# Patient Record
Sex: Male | Born: 1955 | Hispanic: No | Marital: Single
Health system: Southern US, Community
[De-identification: ages and names within clinical notes are randomized; demographics above are authoritative.]

## PROBLEM LIST (undated history)

## (undated) DIAGNOSIS — E079 Disorder of thyroid, unspecified: Secondary | ICD-10-CM

## (undated) DIAGNOSIS — K8689 Other specified diseases of pancreas: Secondary | ICD-10-CM

## (undated) DIAGNOSIS — E1140 Type 2 diabetes mellitus with diabetic neuropathy, unspecified: Secondary | ICD-10-CM

## (undated) DIAGNOSIS — N19 Unspecified kidney failure: Secondary | ICD-10-CM

## (undated) DIAGNOSIS — B1920 Unspecified viral hepatitis C without hepatic coma: Secondary | ICD-10-CM

## (undated) DIAGNOSIS — E039 Hypothyroidism, unspecified: Secondary | ICD-10-CM

## (undated) DIAGNOSIS — I10 Essential (primary) hypertension: Secondary | ICD-10-CM

## (undated) HISTORY — PX: BELOW KNEE LEG AMPUTATION: SUR23

## (undated) HISTORY — PX: KIDNEY TRANSPLANT: SHX239

---

## 2008-11-01 ENCOUNTER — Encounter (HOSPITAL_BASED_OUTPATIENT_CLINIC_OR_DEPARTMENT_OTHER): Admission: RE | Admit: 2008-11-01 | Discharge: 2009-01-25 | Payer: Self-pay | Admitting: General Surgery

## 2008-11-30 ENCOUNTER — Ambulatory Visit: Admission: RE | Admit: 2008-11-30 | Discharge: 2008-11-30 | Payer: Self-pay | Admitting: Internal Medicine

## 2008-11-30 ENCOUNTER — Encounter (HOSPITAL_BASED_OUTPATIENT_CLINIC_OR_DEPARTMENT_OTHER): Payer: Self-pay | Admitting: Internal Medicine

## 2008-11-30 ENCOUNTER — Ambulatory Visit: Payer: Self-pay | Admitting: Surgery

## 2009-01-26 ENCOUNTER — Encounter (HOSPITAL_BASED_OUTPATIENT_CLINIC_OR_DEPARTMENT_OTHER): Admission: RE | Admit: 2009-01-26 | Discharge: 2009-02-22 | Payer: Self-pay | Admitting: Internal Medicine

## 2009-02-10 ENCOUNTER — Ambulatory Visit: Payer: Self-pay | Admitting: Internal Medicine

## 2009-02-10 ENCOUNTER — Inpatient Hospital Stay (HOSPITAL_COMMUNITY): Admission: EM | Admit: 2009-02-10 | Discharge: 2009-02-16 | Payer: Self-pay | Admitting: Emergency Medicine

## 2009-02-11 ENCOUNTER — Encounter (INDEPENDENT_AMBULATORY_CARE_PROVIDER_SITE_OTHER): Payer: Self-pay | Admitting: Internal Medicine

## 2009-02-13 ENCOUNTER — Encounter (INDEPENDENT_AMBULATORY_CARE_PROVIDER_SITE_OTHER): Payer: Self-pay | Admitting: Internal Medicine

## 2009-02-23 ENCOUNTER — Ambulatory Visit: Payer: Self-pay | Admitting: Vascular Surgery

## 2009-02-23 ENCOUNTER — Encounter (HOSPITAL_BASED_OUTPATIENT_CLINIC_OR_DEPARTMENT_OTHER): Admission: RE | Admit: 2009-02-23 | Discharge: 2009-03-29 | Payer: Self-pay | Admitting: Internal Medicine

## 2009-03-24 ENCOUNTER — Encounter (INDEPENDENT_AMBULATORY_CARE_PROVIDER_SITE_OTHER): Payer: Self-pay | Admitting: Orthopedic Surgery

## 2009-03-24 ENCOUNTER — Inpatient Hospital Stay (HOSPITAL_COMMUNITY): Admission: RE | Admit: 2009-03-24 | Discharge: 2009-03-28 | Payer: Self-pay | Admitting: Orthopedic Surgery

## 2010-03-31 IMAGING — CR DG FOOT COMPLETE 3+V*R*
3 series · 3 of 3 positions shown · non-contrast
Comparison: None

CLINICAL DATA: Open wound at the heel, the foot is swollen, the
patient is diabetic

RIGHT FOOT COMPLETE - 3+ VIEW

[view not recorded (1 of 3)]
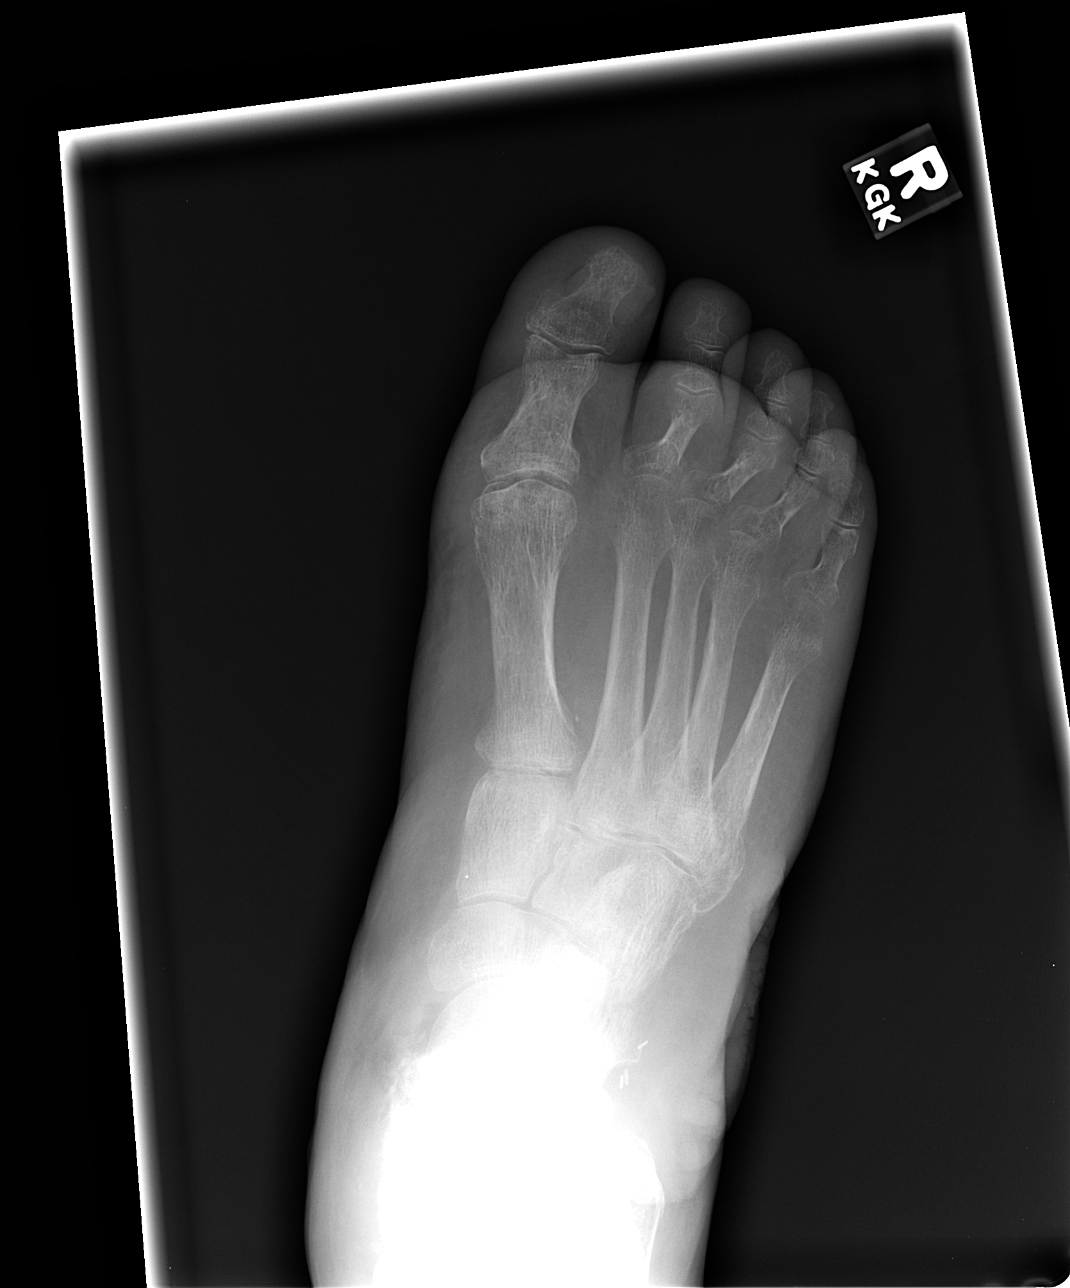

[view not recorded (2 of 3)]
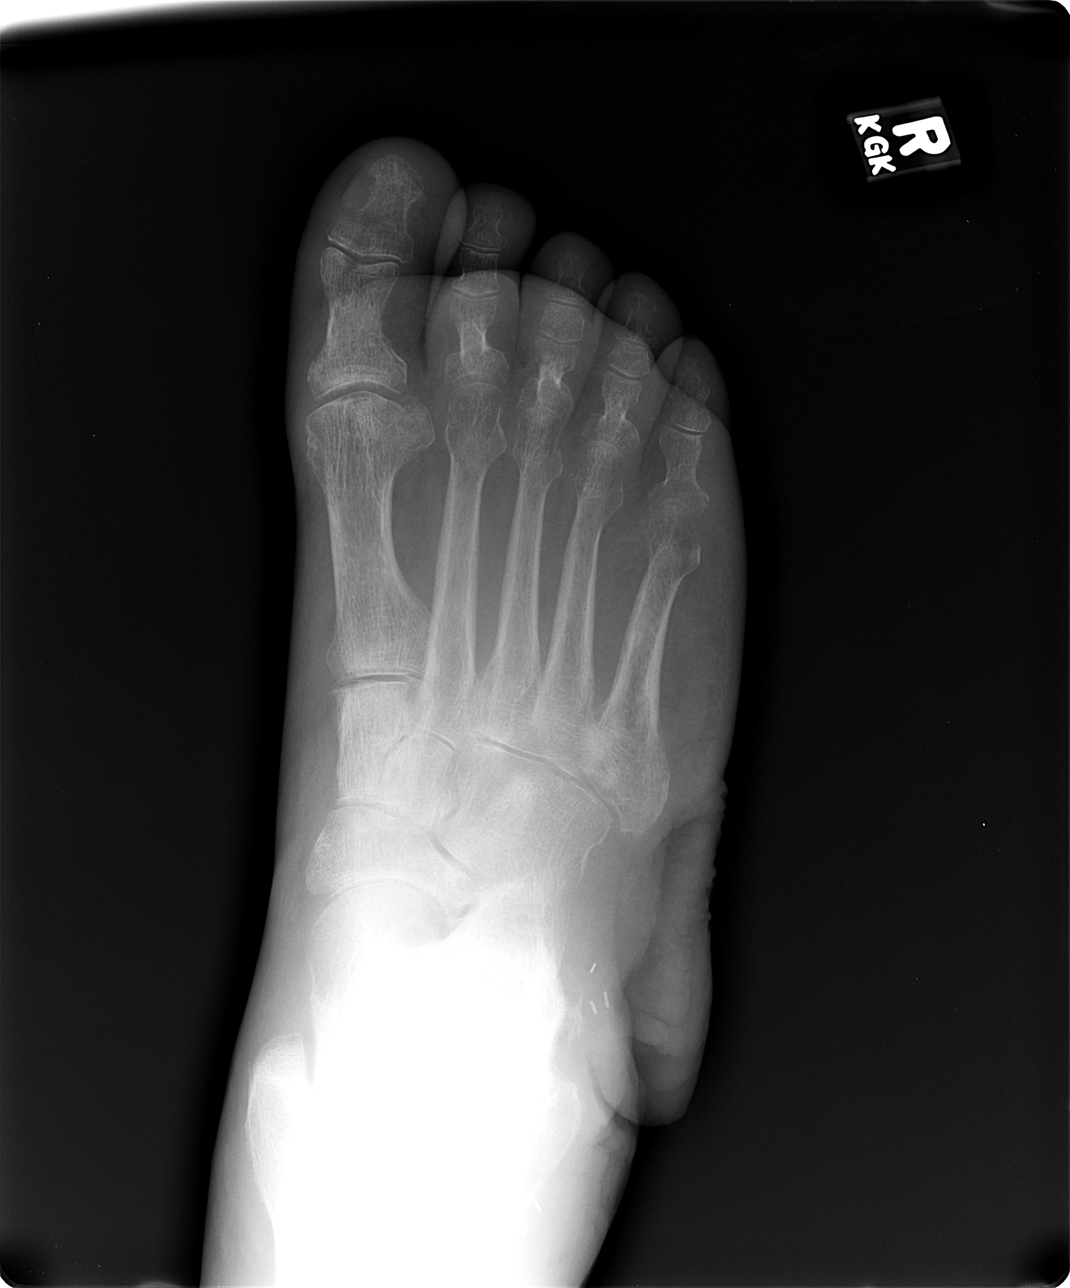

[view not recorded (3 of 3)]
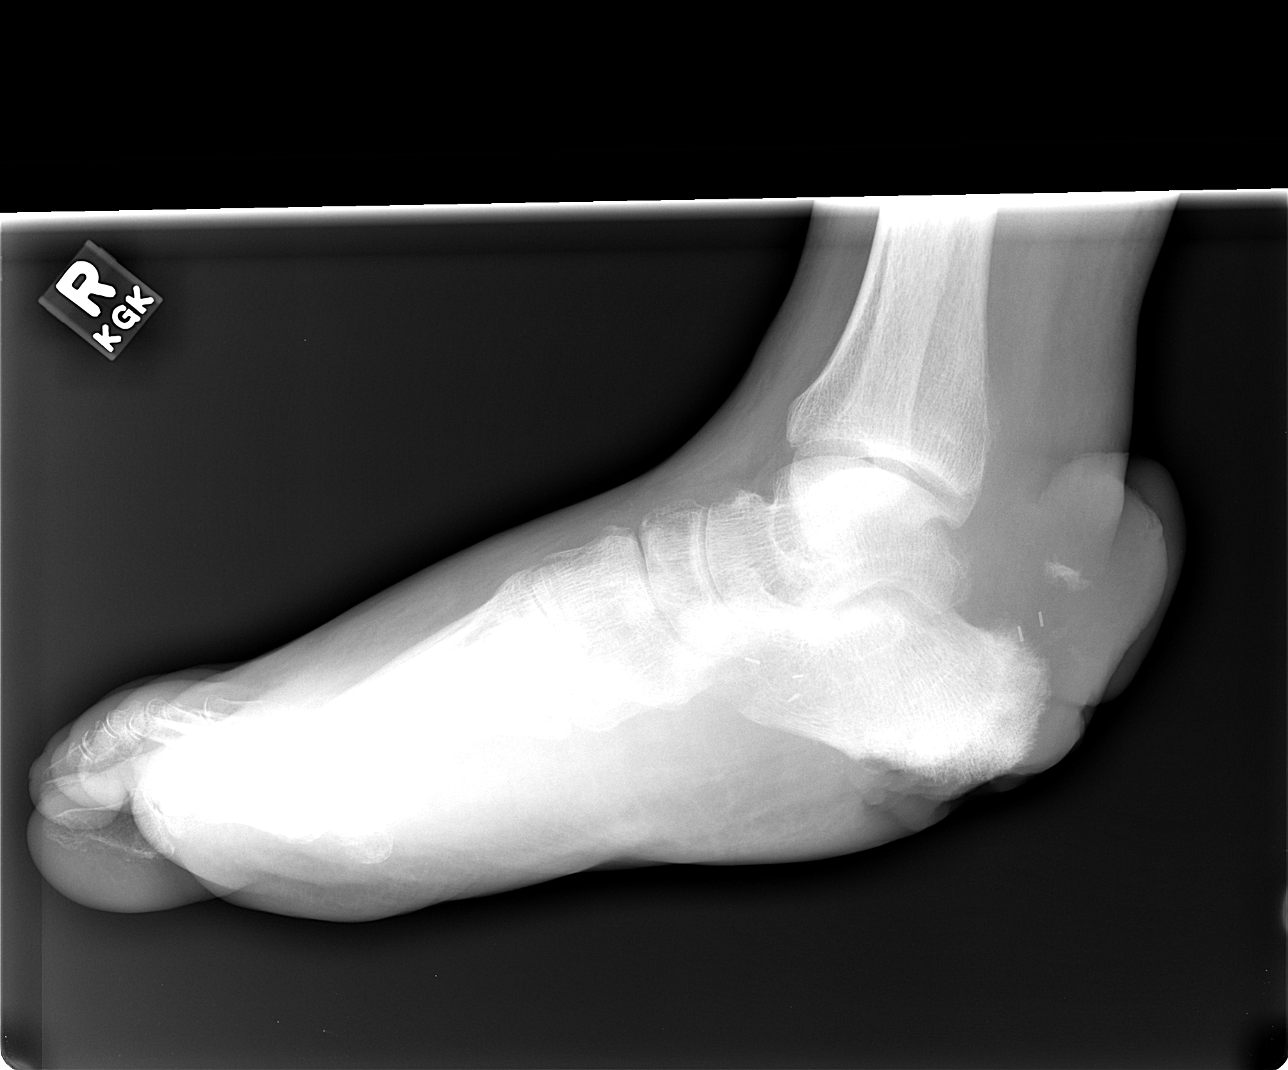

[3 of 3 positions shown; findings below may reference images not displayed]

FINDINGS: There is a large ulceration along the posterior plantar
aspect of the os calcis with loss of cortical margin and
demineralization, consistent with active osteomyelitis.  A small
bone fragment is noted within the soft tissues above the expected
insertion of the Achilles tendon which may be due to prior trauma.
The bones are very osteopenic.  There are several foci of slight
demineralization and some cortical thinning involving the distal
aspect of the first metatarsal neck and the base of the distal
phalanx of the right great toe.  In addition there is focal
demineralization involving the head of the right fifth metatarsal.
Osteomyelitis cannot be excluded at these sites.  Diffuse soft
tissue swelling is noted.
IMPRESSION: 1.  There is loss of the cortical margins and demineralization of
the plantar aspect and posterior aspect of the os calcis consistent
with osteomyelitis with a large soft tissue wound at that site.
2.  There are other foci of focal demineralization and cortical
loss involving the right first metatarsal, base of the proximal
phalanx of the right great toe, and right fifth metatarsal,
suspicious for areas of osteomyelitis as well.

## 2010-04-05 IMAGING — CR DG CHEST 1V PORT
1 series · 1 of 1 positions shown · non-contrast
Comparison: None

CLINICAL DATA: PICC line placement.

PORTABLE CHEST - 1 VIEW

[view not recorded]
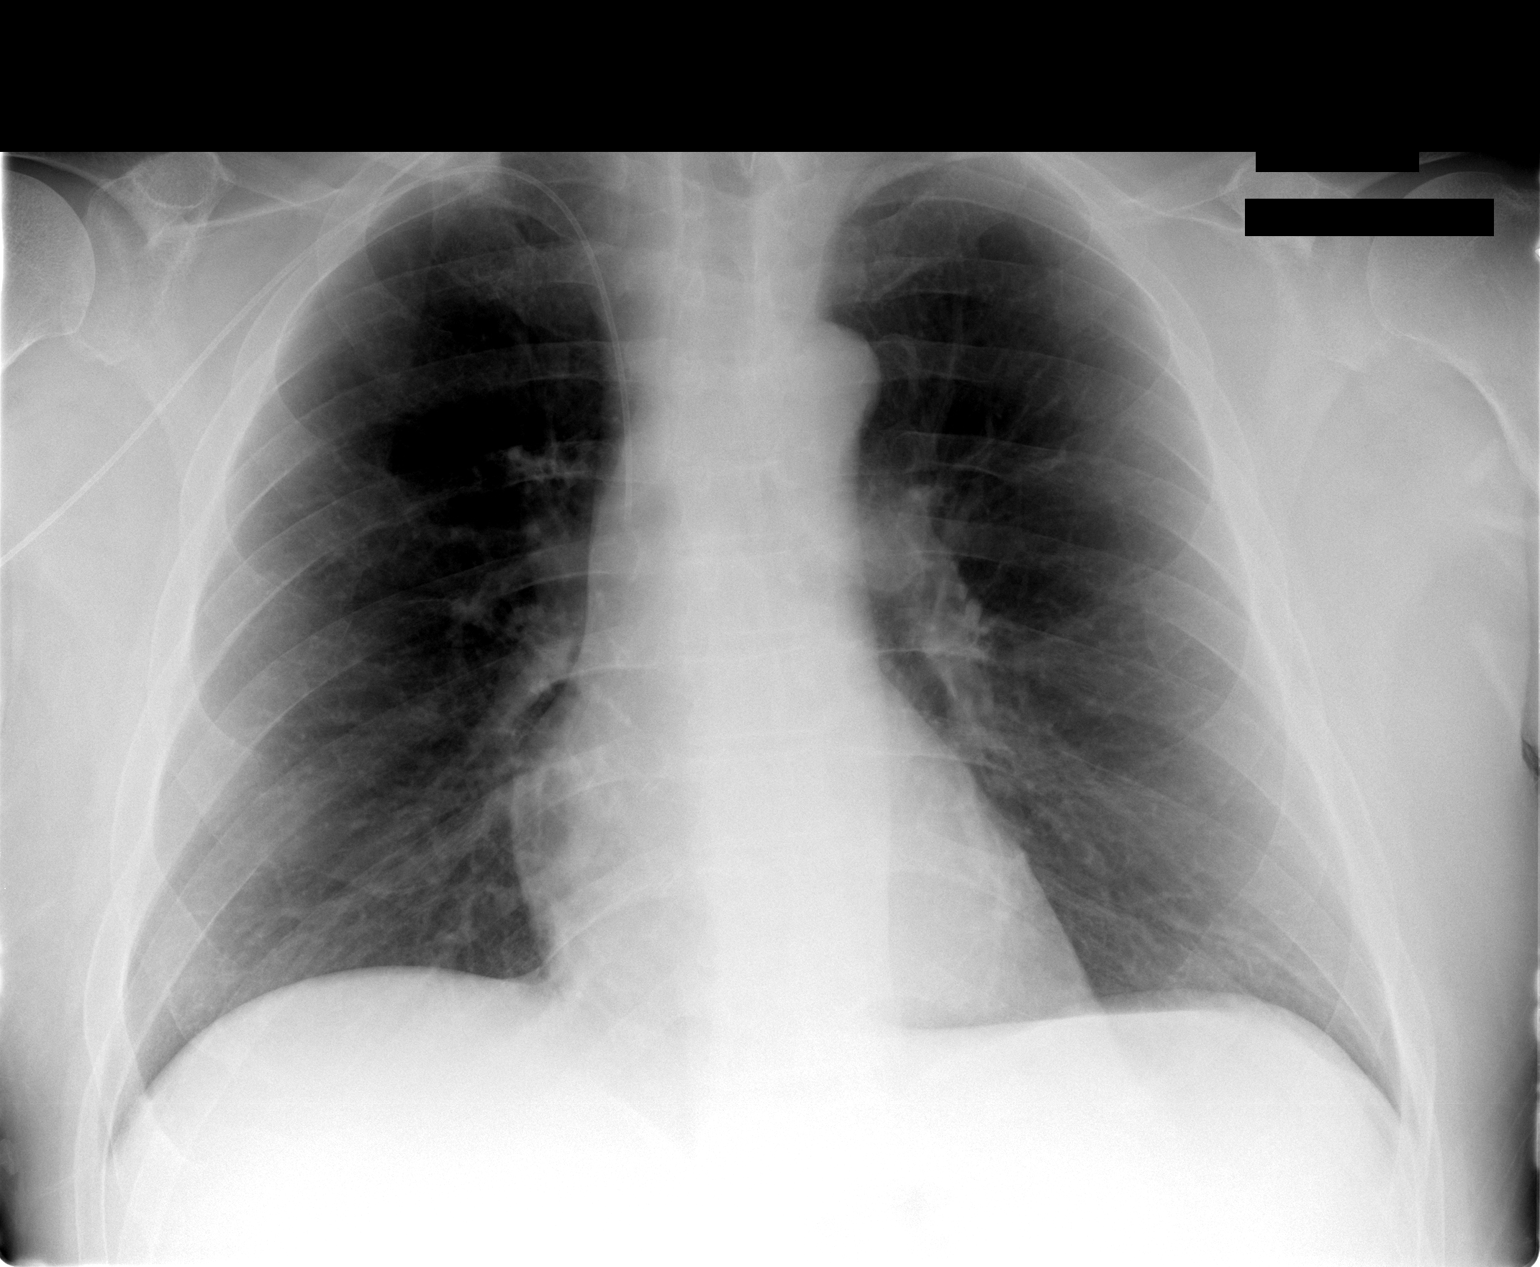

[1 of 1 positions shown; findings below may reference images not displayed]

FINDINGS: Right PICC line is in place with the tip in the SVC.
Lungs are clear.  Heart is normal size.  No effusions.
IMPRESSION: Right PICC line tip in the SVC.

## 2010-05-10 LAB — CBC
MCHC: 34.6 g/dL (ref 30.0–36.0)
RBC: 5.48 MIL/uL (ref 4.22–5.81)
WBC: 7.9 10*3/uL (ref 4.0–10.5)

## 2010-05-10 LAB — COMPREHENSIVE METABOLIC PANEL
Albumin: 2.9 g/dL — ABNORMAL LOW (ref 3.5–5.2)
Alkaline Phosphatase: 57 U/L (ref 39–117)
BUN: 9 mg/dL (ref 6–23)
Calcium: 9.2 mg/dL (ref 8.4–10.5)
Potassium: 3.9 mEq/L (ref 3.5–5.1)
Sodium: 132 mEq/L — ABNORMAL LOW (ref 135–145)
Total Protein: 7.2 g/dL (ref 6.0–8.3)

## 2010-05-10 LAB — GLUCOSE, CAPILLARY
Glucose-Capillary: 131 mg/dL — ABNORMAL HIGH (ref 70–99)
Glucose-Capillary: 158 mg/dL — ABNORMAL HIGH (ref 70–99)
Glucose-Capillary: 183 mg/dL — ABNORMAL HIGH (ref 70–99)
Glucose-Capillary: 186 mg/dL — ABNORMAL HIGH (ref 70–99)
Glucose-Capillary: 192 mg/dL — ABNORMAL HIGH (ref 70–99)
Glucose-Capillary: 195 mg/dL — ABNORMAL HIGH (ref 70–99)
Glucose-Capillary: 196 mg/dL — ABNORMAL HIGH (ref 70–99)
Glucose-Capillary: 237 mg/dL — ABNORMAL HIGH (ref 70–99)
Glucose-Capillary: 287 mg/dL — ABNORMAL HIGH (ref 70–99)
Glucose-Capillary: 348 mg/dL — ABNORMAL HIGH (ref 70–99)

## 2010-05-10 LAB — URINALYSIS, ROUTINE W REFLEX MICROSCOPIC
Nitrite: NEGATIVE
Specific Gravity, Urine: 1.02 (ref 1.005–1.030)
Urobilinogen, UA: 1 mg/dL (ref 0.0–1.0)

## 2010-05-10 LAB — ABO/RH: ABO/RH(D): A POS

## 2010-05-10 LAB — PROTIME-INR
INR: 1.15 (ref 0.00–1.49)
Prothrombin Time: 14.6 seconds (ref 11.6–15.2)

## 2010-05-10 LAB — TYPE AND SCREEN
ABO/RH(D): A POS
Antibody Screen: NEGATIVE

## 2010-05-10 LAB — HEMOGLOBIN AND HEMATOCRIT, BLOOD: Hemoglobin: 13.4 g/dL (ref 13.0–17.0)

## 2010-05-22 LAB — BASIC METABOLIC PANEL
BUN: 10 mg/dL (ref 6–23)
BUN: 11 mg/dL (ref 6–23)
CO2: 26 mEq/L (ref 19–32)
CO2: 28 mEq/L (ref 19–32)
CO2: 30 mEq/L (ref 19–32)
CO2: 31 mEq/L (ref 19–32)
Calcium: 9.3 mg/dL (ref 8.4–10.5)
Calcium: 9.3 mg/dL (ref 8.4–10.5)
Chloride: 103 mEq/L (ref 96–112)
Chloride: 106 mEq/L (ref 96–112)
Chloride: 96 mEq/L (ref 96–112)
Creatinine, Ser: 0.91 mg/dL (ref 0.4–1.5)
Creatinine, Ser: 0.92 mg/dL (ref 0.4–1.5)
Creatinine, Ser: 1.14 mg/dL (ref 0.4–1.5)
GFR calc Af Amer: >60 mL/min (ref >60)
GFR calc Af Amer: >60 mL/min (ref >60)
GFR calc Af Amer: >60 mL/min (ref >60)
GFR calc Af Amer: >60 mL/min (ref >60)
GFR calc non Af Amer: >60 mL/min (ref >60)
GFR calc non Af Amer: >60 mL/min (ref >60)
GFR calc non Af Amer: >60 mL/min (ref >60)
Glucose, Bld: 162 mg/dL — ABNORMAL HIGH (ref 70–99)
Glucose, Bld: 198 mg/dL — ABNORMAL HIGH (ref 70–99)
Glucose, Bld: 343 mg/dL — ABNORMAL HIGH (ref 70–99)
Potassium: 3.7 mEq/L (ref 3.5–5.1)
Potassium: 4.1 mEq/L (ref 3.5–5.1)
Potassium: 4.5 mEq/L (ref 3.5–5.1)
Sodium: 134 mEq/L — ABNORMAL LOW (ref 135–145)
Sodium: 134 mEq/L — ABNORMAL LOW (ref 135–145)
Sodium: 135 mEq/L (ref 135–145)
Sodium: 139 mEq/L (ref 135–145)
Sodium: 142 mEq/L (ref 135–145)

## 2010-05-22 LAB — CBC
HCT: 43.1 % (ref 39.0–52.0)
HCT: 45 % (ref 39.0–52.0)
HCT: 45.8 % (ref 39.0–52.0)
Hemoglobin: 13.5 g/dL (ref 13.0–17.0)
Hemoglobin: 14.3 g/dL (ref 13.0–17.0)
Hemoglobin: 14.3 g/dL (ref 13.0–17.0)
Hemoglobin: 15.6 g/dL (ref 13.0–17.0)
MCHC: 32.1 g/dL (ref 30.0–36.0)
MCHC: 32.2 g/dL (ref 30.0–36.0)
MCHC: 32.7 g/dL (ref 30.0–36.0)
MCHC: 34.1 g/dL (ref 30.0–36.0)
MCV: 85.9 fL (ref 78.0–100.0)
MCV: 86.3 fL (ref 78.0–100.0)
Platelets: 132 10*3/uL — ABNORMAL LOW (ref 150–400)
Platelets: 139 10*3/uL — ABNORMAL LOW (ref 150–400)
Platelets: 147 10*3/uL — ABNORMAL LOW (ref 150–400)
RBC: 4.88 MIL/uL (ref 4.22–5.81)
RBC: 5.09 MIL/uL (ref 4.22–5.81)
RBC: 5.1 MIL/uL (ref 4.22–5.81)
RDW: 16.4 % — ABNORMAL HIGH (ref 11.5–15.5)
RDW: 16.5 % — ABNORMAL HIGH (ref 11.5–15.5)
RDW: 16.8 % — ABNORMAL HIGH (ref 11.5–15.5)
RDW: 17.1 % — ABNORMAL HIGH (ref 11.5–15.5)
RDW: 17.3 % — ABNORMAL HIGH (ref 11.5–15.5)
WBC: 3.8 10*3/uL — ABNORMAL LOW (ref 4.0–10.5)
WBC: 3.9 10*3/uL — ABNORMAL LOW (ref 4.0–10.5)
WBC: 4.1 10*3/uL (ref 4.0–10.5)

## 2010-05-22 LAB — WOUND CULTURE: Culture: NO GROWTH

## 2010-05-22 LAB — GLUCOSE, CAPILLARY
Glucose-Capillary: 149 mg/dL — ABNORMAL HIGH (ref 70–99)
Glucose-Capillary: 154 mg/dL — ABNORMAL HIGH (ref 70–99)
Glucose-Capillary: 155 mg/dL — ABNORMAL HIGH (ref 70–99)
Glucose-Capillary: 157 mg/dL — ABNORMAL HIGH (ref 70–99)
Glucose-Capillary: 169 mg/dL — ABNORMAL HIGH (ref 70–99)
Glucose-Capillary: 187 mg/dL — ABNORMAL HIGH (ref 70–99)
Glucose-Capillary: 194 mg/dL — ABNORMAL HIGH (ref 70–99)
Glucose-Capillary: 211 mg/dL — ABNORMAL HIGH (ref 70–99)
Glucose-Capillary: 233 mg/dL — ABNORMAL HIGH (ref 70–99)
Glucose-Capillary: 256 mg/dL — ABNORMAL HIGH (ref 70–99)
Glucose-Capillary: 333 mg/dL — ABNORMAL HIGH (ref 70–99)
Glucose-Capillary: 340 mg/dL — ABNORMAL HIGH (ref 70–99)
Glucose-Capillary: 343 mg/dL — ABNORMAL HIGH (ref 70–99)
Glucose-Capillary: 354 mg/dL — ABNORMAL HIGH (ref 70–99)
Glucose-Capillary: 36 mg/dL — CL (ref 70–99)
Glucose-Capillary: 384 mg/dL — ABNORMAL HIGH (ref 70–99)
Glucose-Capillary: 58 mg/dL — ABNORMAL LOW (ref 70–99)
Glucose-Capillary: 75 mg/dL (ref 70–99)
Glucose-Capillary: 83 mg/dL (ref 70–99)
Glucose-Capillary: 90 mg/dL (ref 70–99)
Glucose-Capillary: 91 mg/dL (ref 70–99)

## 2010-05-22 LAB — DIFFERENTIAL
Basophils Absolute: 0 10*3/uL (ref 0.0–0.1)
Basophils Absolute: 0.1 10*3/uL (ref 0.0–0.1)
Basophils Relative: 0 % (ref 0–1)
Eosinophils Absolute: 0 10*3/uL (ref 0.0–0.7)
Eosinophils Absolute: 0.2 10*3/uL (ref 0.0–0.7)
Eosinophils Relative: 1 % (ref 0–5)
Lymphocytes Relative: 24 % (ref 12–46)
Lymphs Abs: 2.6 10*3/uL (ref 0.7–4.0)
Monocytes Absolute: 1 10*3/uL (ref 0.1–1.0)
Neutro Abs: 8.1 10*3/uL — ABNORMAL HIGH (ref 1.7–7.7)

## 2010-05-22 LAB — LIPID PANEL
Cholesterol: 117 mg/dL (ref 0–200)
HDL: 18 mg/dL — ABNORMAL LOW (ref >39)
LDL Cholesterol: 67 mg/dL (ref 0–99)
Total CHOL/HDL Ratio: 6.5 RATIO

## 2010-05-22 LAB — CULTURE, BLOOD (ROUTINE X 2): Culture: NO GROWTH

## 2010-05-22 LAB — ANAEROBIC CULTURE

## 2010-05-22 LAB — TSH: TSH: 1.403 u[IU]/mL (ref 0.350–4.500)

## 2010-05-22 LAB — PROTIME-INR
INR: 1.03 (ref 0.00–1.49)
Prothrombin Time: 13.4 seconds (ref 11.6–15.2)

## 2010-05-23 LAB — GLUCOSE, CAPILLARY
Glucose-Capillary: 223 mg/dL — ABNORMAL HIGH (ref 70–99)
Glucose-Capillary: 273 mg/dL — ABNORMAL HIGH (ref 70–99)
Glucose-Capillary: 336 mg/dL — ABNORMAL HIGH (ref 70–99)
Glucose-Capillary: 338 mg/dL — ABNORMAL HIGH (ref 70–99)
Glucose-Capillary: 348 mg/dL — ABNORMAL HIGH (ref 70–99)

## 2010-05-24 LAB — GLUCOSE, CAPILLARY
Glucose-Capillary: 138 mg/dL — ABNORMAL HIGH (ref 70–99)
Glucose-Capillary: 170 mg/dL — ABNORMAL HIGH (ref 70–99)
Glucose-Capillary: 176 mg/dL — ABNORMAL HIGH (ref 70–99)
Glucose-Capillary: 176 mg/dL — ABNORMAL HIGH (ref 70–99)
Glucose-Capillary: 190 mg/dL — ABNORMAL HIGH (ref 70–99)
Glucose-Capillary: 264 mg/dL — ABNORMAL HIGH (ref 70–99)
Glucose-Capillary: 318 mg/dL — ABNORMAL HIGH (ref 70–99)
Glucose-Capillary: 464 mg/dL — ABNORMAL HIGH (ref 70–99)
Glucose-Capillary: 100 mg/dL — ABNORMAL HIGH (ref 70–99)
Glucose-Capillary: 109 mg/dL — ABNORMAL HIGH (ref 70–99)
Glucose-Capillary: 198 mg/dL — ABNORMAL HIGH (ref 70–99)
Glucose-Capillary: 227 mg/dL — ABNORMAL HIGH (ref 70–99)
Glucose-Capillary: 261 mg/dL — ABNORMAL HIGH (ref 70–99)
Glucose-Capillary: 291 mg/dL — ABNORMAL HIGH (ref 70–99)
Glucose-Capillary: 300 mg/dL — ABNORMAL HIGH (ref 70–99)
Glucose-Capillary: 304 mg/dL — ABNORMAL HIGH (ref 70–99)
Glucose-Capillary: 320 mg/dL — ABNORMAL HIGH (ref 70–99)
Glucose-Capillary: 360 mg/dL — ABNORMAL HIGH (ref 70–99)
Glucose-Capillary: 367 mg/dL — ABNORMAL HIGH (ref 70–99)
Glucose-Capillary: 93 mg/dL (ref 70–99)
Glucose-Capillary: 96 mg/dL (ref 70–99)

## 2010-05-25 LAB — GLUCOSE, CAPILLARY
Glucose-Capillary: 250 mg/dL — ABNORMAL HIGH (ref 70–99)
Glucose-Capillary: 292 mg/dL — ABNORMAL HIGH (ref 70–99)
Glucose-Capillary: 293 mg/dL — ABNORMAL HIGH (ref 70–99)
Glucose-Capillary: 296 mg/dL — ABNORMAL HIGH (ref 70–99)
Glucose-Capillary: 454 mg/dL — ABNORMAL HIGH (ref 70–99)
Glucose-Capillary: 490 mg/dL — ABNORMAL HIGH (ref 70–99)

## 2011-02-20 ENCOUNTER — Emergency Department (HOSPITAL_BASED_OUTPATIENT_CLINIC_OR_DEPARTMENT_OTHER)
Admission: EM | Admit: 2011-02-20 | Discharge: 2011-02-20 | Disposition: A | Discharge status: Home / Self Care | Payer: Medicare Other | Attending: Emergency Medicine | Admitting: Emergency Medicine

## 2011-02-20 ENCOUNTER — Encounter: Payer: Self-pay | Admitting: Emergency Medicine

## 2011-02-20 ENCOUNTER — Emergency Department (INDEPENDENT_AMBULATORY_CARE_PROVIDER_SITE_OTHER): Payer: Medicare Other

## 2011-02-20 DIAGNOSIS — J3489 Other specified disorders of nose and nasal sinuses: Secondary | ICD-10-CM | POA: Insufficient documentation

## 2011-02-20 DIAGNOSIS — E109 Type 1 diabetes mellitus without complications: Secondary | ICD-10-CM | POA: Insufficient documentation

## 2011-02-20 DIAGNOSIS — R0602 Shortness of breath: Secondary | ICD-10-CM | POA: Insufficient documentation

## 2011-02-20 DIAGNOSIS — I10 Essential (primary) hypertension: Secondary | ICD-10-CM | POA: Insufficient documentation

## 2011-02-20 DIAGNOSIS — Z79899 Other long term (current) drug therapy: Secondary | ICD-10-CM | POA: Insufficient documentation

## 2011-02-20 DIAGNOSIS — R05 Cough: Secondary | ICD-10-CM | POA: Insufficient documentation

## 2011-02-20 DIAGNOSIS — R059 Cough, unspecified: Secondary | ICD-10-CM | POA: Insufficient documentation

## 2011-02-20 DIAGNOSIS — J45909 Unspecified asthma, uncomplicated: Secondary | ICD-10-CM | POA: Insufficient documentation

## 2011-02-20 HISTORY — DX: Essential (primary) hypertension: I10

## 2011-02-20 HISTORY — DX: Unspecified kidney failure: N19

## 2011-02-20 HISTORY — DX: Unspecified viral hepatitis C without hepatic coma: B19.20

## 2011-02-20 HISTORY — DX: Other specified diseases of pancreas: K86.89

## 2011-02-20 HISTORY — DX: Hypothyroidism, unspecified: E03.9

## 2011-02-20 HISTORY — DX: Type 2 diabetes mellitus with diabetic neuropathy, unspecified: E11.40

## 2011-02-20 HISTORY — DX: Disorder of thyroid, unspecified: E07.9

## 2011-02-20 MED ORDER — IPRATROPIUM BROMIDE 0.02 % IN SOLN
0.5000 mg | Freq: Once | RESPIRATORY_TRACT | Status: AC
  Administered 2011-02-20: 0.5 mg via RESPIRATORY_TRACT
  Filled 2011-02-20: qty 2.5

## 2011-02-20 MED ORDER — ALBUTEROL SULFATE (5 MG/ML) 0.5% IN NEBU
5.0000 mg | INHALATION_SOLUTION | Freq: Once | RESPIRATORY_TRACT | Status: AC
  Administered 2011-02-20: 5 mg via RESPIRATORY_TRACT
  Filled 2011-02-20: qty 1

## 2011-02-20 MED ORDER — BENZONATATE 100 MG PO CAPS: 100.0000 mg | ORAL_CAPSULE | Freq: Three times a day (TID) | ORAL | Status: AC

## 2011-02-20 NOTE — ED Provider Notes (Signed)
History     CSN: [id number]  Arrival date & time 02/20/11  1606   First MD Initiated Contact with Patient 02/20/11 1620      Chief Complaint  Patient presents with  . Nasal Congestion  . Cough  . Shortness of Breath    (Consider location/radiation/quality/duration/timing/severity/associated sxs/prior treatment) Patient is a 56 y.o. male presenting with cough and shortness of breath. The history is provided by the patient. No language interpreter was used.  Cough This is a new problem. The current episode started more than 2 days ago. The problem occurs hourly. The problem has been gradually worsening. The cough is productive of sputum. There has been no fever. Associated symptoms include rhinorrhea and shortness of breath. Pertinent negatives include no chills. He is not a smoker.  Shortness of Breath  Associated symptoms include rhinorrhea, cough and shortness of breath.    Past Medical History  Diagnosis Date  . Kidney failure   . Hepatitis C   . Asthma   . Hypertension   . Thyroid disease   . Hypothyroidism   . Diabetic retinopathy   . Diabetic neuropathy   . Diabetes mellitus     type 1  . Pancreas (digestive gland) works poorly     Past Surgical History  Procedure Date  . Kidney transplant   . Below knee leg amputation     History reviewed. No pertinent family history.  History  Substance Use Topics  . Smoking status: Not on file  . Smokeless tobacco: Not on file  . Alcohol Use:       Review of Systems  Constitutional: Negative for chills.  HENT: Positive for rhinorrhea.   Respiratory: Positive for cough and shortness of breath.   All other systems reviewed and are negative.    Allergies  Tape  Home Medications   Current Outpatient Rx  Name Route Sig Dispense Refill  . ALBUTEROL SULFATE HFA 108 (90 BASE) MCG/ACT IN AERS Inhalation Inhale 2 puffs into the lungs every 6 (six) hours as needed. For shortness of breath      . AMITRIPTYLINE HCL  25 MG PO TABS Oral Take 25 mg by mouth at bedtime.      . ASPIRIN 81 MG PO TABS Oral Take 81 mg by mouth daily.      Marland Kitchen BACLOFEN 10 MG PO TABS Oral Take 10 mg by mouth at bedtime.      Marland Kitchen CALCIUM 600 + D PO Oral Take 1 tablet by mouth at bedtime.      Marland Kitchen DIAZEPAM 10 MG PO TABS Oral Take 10 mg by mouth 3 (three) times daily.      Marland Kitchen DOCUSATE SODIUM 100 MG PO CAPS Oral Take 200 mg by mouth at bedtime.      . FENOFIBRATE 145 MG PO TABS Oral Take 145 mg by mouth daily.      . OMEGA-3 FATTY ACIDS 1000 MG PO CAPS Oral Take 1 g by mouth 2 (two) times daily.      . FUROSEMIDE 20 MG PO TABS Oral Take 20 mg by mouth daily.      Marland Kitchen HYDROCODONE-ACETAMINOPHEN 10-325 MG PO TABS Oral Take 1 tablet by mouth every 6 (six) hours as needed. For pain      . INSULIN PUMP Subcutaneous Inject into the skin continuous. humalog     . LEVOTHYROXINE SODIUM 100 MCG PO TABS Oral Take 100 mcg by mouth daily.      Marland Kitchen LOSARTAN POTASSIUM 25 MG PO TABS  Oral Take 25 mg by mouth daily.      Marland Kitchen MAGNESIUM OXIDE 400 MG PO TABS Oral Take 800 mg by mouth every morning.      Marland Kitchen MAGNESIUM OXIDE 400 MG PO TABS Oral Take 400 mg by mouth every evening.      Marland Kitchen METOCLOPRAMIDE HCL 10 MG PO TABS Oral Take 10 mg by mouth 4 (four) times daily. 30 minutes before meals and at bedtime      . ADULT MULTIVITAMIN W/MINERALS CH Oral Take 1 tablet by mouth daily.      Marland Kitchen MYCOPHENOLATE MOFETIL 500 MG PO TABS Oral Take 500 mg by mouth 2 (two) times daily.      Marland Kitchen OMEPRAZOLE 40 MG PO CPDR Oral Take 40 mg by mouth daily.      Marland Kitchen PROMETHAZINE HCL 25 MG PO TABS Oral Take 25 mg by mouth daily. And as needed for nausea      . TACROLIMUS 1 MG PO CAPS Oral Take 1 mg by mouth 2 (two) times daily.        BP 147/83  Pulse 104  Temp(Src) 97.6 F (36.4 C) (Oral)  Resp 20  SpO2 95%  Physical Exam  Nursing note and vitals reviewed. Constitutional: He is oriented to person, place, and time. He appears well-developed and well-nourished.  HENT:  Head: Normocephalic.    Right Ear: External ear normal.  Left Ear: External ear normal.  Mouth/Throat: Oropharynx is clear and moist.  Neck: Normal range of motion. Neck supple.  Cardiovascular: Normal rate and regular rhythm.   Pulmonary/Chest: Effort normal.       Pt has expiratory wheezing without any distress  Abdominal: Soft.  Musculoskeletal: Normal range of motion.       Pt has a right bka  Neurological: He is alert and oriented to person, place, and time.  Skin: Skin is warm and dry.  Psychiatric: He has a normal mood and affect.    ED Course  Procedures (including critical care time)  Labs Reviewed - No data to display Dg Chest 2 View  02/20/2011  *RADIOLOGY REPORT*  Clinical Data: Cough  CHEST - 2 VIEW  Comparison: 02/15/2009  Findings: Cardiomediastinal silhouette is stable.  No acute infiltrate or pleural effusion.  No pulmonary edema.  Central mild increase bronchial markings without focal consolidation. Bony thorax is stable.  IMPRESSION: No acute infiltrate or pulmonary edema.  Central mild increase bronchial markings without focal consolidation.  Original Report Authenticated By: Natasha Mead, M.D.     1. Cough       MDM  Pt no longer wheezing after treatment:discused with pt the need for using his inhaler at home over the next couple of day:pt requesting something for cough        Teressa Lower, NP 02/20/11 1752

## 2011-02-20 NOTE — ED Notes (Signed)
Pt having cough, chest congestion, sl sob x 3 days.  Generalized aches and pains.  Productive yellow sputum.

## 2011-02-20 NOTE — ED Provider Notes (Signed)
Medical screening examination/treatment/procedure(s) were performed by non-physician practitioner and as supervising physician I was immediately available for consultation/collaboration.   Forbes Cellar, MD 02/20/11 [phone]

## 2011-02-20 NOTE — ED Notes (Signed)
States fells better

## 2017-02-28 ENCOUNTER — Ambulatory Visit (INDEPENDENT_AMBULATORY_CARE_PROVIDER_SITE_OTHER): Payer: Medicare Other | Admitting: Family

## 2017-02-28 ENCOUNTER — Encounter (INDEPENDENT_AMBULATORY_CARE_PROVIDER_SITE_OTHER): Payer: Self-pay | Admitting: Orthopedic Surgery

## 2017-02-28 DIAGNOSIS — L97201 Non-pressure chronic ulcer of unspecified calf limited to breakdown of skin: Secondary | ICD-10-CM

## 2017-02-28 DIAGNOSIS — Z89512 Acquired absence of left leg below knee: Secondary | ICD-10-CM

## 2017-02-28 DIAGNOSIS — Z89511 Acquired absence of right leg below knee: Secondary | ICD-10-CM | POA: Diagnosis not present

## 2017-02-28 MED ORDER — SILVER SULFADIAZINE 1 % EX CREA: 1.0000 "application " | TOPICAL_CREAM | Freq: Every day | CUTANEOUS | 0 refills | Status: AC

## 2017-02-28 NOTE — Progress Notes (Signed)
Office Visit Note   Patient: Craig Kemp           Date of Birth: April 27, 1955           MRN: [id number] Visit Date: 02/28/2017              Requested by: No referring provider defined for this encounter. PCP: Bailey MechPodraza, Cole Christopher, PA-C  No chief complaint on file.     HPI: The patient is a 62 year old gentleman seen today for evaluation of blistered ulcerations to bilateral lower extremities. Is currently in PT for gait training. Reports noticed ulcers to R BKA on 02/21/17. Has been using L BKA for pivoting. Using IV cover dressings.   Assessment & Plan: Visit Diagnoses:  1. S/P bilateral below knee amputation (HCC)   2. Non-pressure chronic ulcer of calf, unspecified laterality, limited to breakdown of skin (HCC)     Plan: no weight bearing in prostheses until healed. Silvadene dressings to ulcerative areas daily following wound cleansing. Follow up in 2 weeks. Do wear shrinkers.  Follow-Up Instructions: Return in about 2 weeks (around 03/14/2017).   Ortho Exam  Patient is alert, oriented, no adenopathy, well-dressed, normal affect, normal respiratory effort. On examination of R BKA has large ulceration distally. This is 5 cm in diameter with no depth. Bleeding granulation tissue in bed. No surrounding erythema, warmth or sign of infection. Does have a small blister posterior that is dime sized and fluid filled. No sign of infection. Has swelling to the R residual limb. Patient states is from being dependent. Patient doubts increased volume is cause of ulceration. L BKA with blister to distal tip as well. Is fluid filled is 15 mm in length and 4 mm wide. No erythema, drainage or warmth.   Imaging: No results found. No images are attached to the encounter.  Labs: Lab Results  Component Value Date   HGBA1C (H) 02/10/2009    7.4 (NOTE) The ADA recommends the following therapeutic goal for glycemic control related to Hgb A1c measurement: Goal of therapy: <6.5 Hgb A1c   Reference: American Diabetes Association: Clinical Practice Recommendations 2010, Diabetes Care, 2010, 33: (Suppl  1).   REPTSTATUS 02/15/2009 FINAL 02/13/2009   GRAMSTAIN  02/13/2009    RARE WCBP NO SQUAMOUS EPITHELIAL CELLS SEEN NO ORGANISMS SEEN   CULT NO GROWTH 02/13/2009    @LABSALLVALUES (HGBA1)@  There is no height or weight on file to calculate BMI.  Orders:  No orders of the defined types were placed in this encounter.  No orders of the defined types were placed in this encounter.    Procedures: No procedures performed  Clinical Data: No additional findings.  ROS:  All other systems negative, except as noted in the HPI. Review of Systems  Constitutional: Negative for chills and fever.  Cardiovascular: Positive for leg swelling.  Skin: Positive for wound. Negative for color change and rash.    Objective: Vital Signs: There were no vitals taken for this visit.  Specialty Comments:  No specialty comments available.  PMFS History: Patient Active Problem List   Diagnosis Date Noted  . S/P bilateral below knee amputation (HCC) 02/28/2017   Past Medical History:  Diagnosis Date  . Asthma   . Diabetes mellitus    type 1  . Diabetic neuropathy (HCC)   . Diabetic retinopathy   . Hepatitis C   . Hypertension   . Hypothyroidism   . Kidney failure   . Pancreas (digestive gland) works poorly   .  Thyroid disease     History reviewed. No pertinent family history.  Past Surgical History:  Procedure Laterality Date  . BELOW KNEE LEG AMPUTATION    . KIDNEY TRANSPLANT     Social History   Occupational History  . Not on file  Tobacco Use  . Smoking status: Not on file  Substance and Sexual Activity  . Alcohol use: Not on file  . Drug use: Not on file  . Sexual activity: Not on file

## 2017-03-14 ENCOUNTER — Encounter (INDEPENDENT_AMBULATORY_CARE_PROVIDER_SITE_OTHER): Payer: Self-pay | Admitting: Orthopedic Surgery

## 2017-03-14 ENCOUNTER — Ambulatory Visit (INDEPENDENT_AMBULATORY_CARE_PROVIDER_SITE_OTHER): Payer: Medicare Other | Admitting: Orthopedic Surgery

## 2017-03-14 DIAGNOSIS — Z89511 Acquired absence of right leg below knee: Secondary | ICD-10-CM | POA: Diagnosis not present

## 2017-03-14 DIAGNOSIS — Z89512 Acquired absence of left leg below knee: Secondary | ICD-10-CM | POA: Diagnosis not present

## 2017-03-14 NOTE — Progress Notes (Signed)
   Office Visit Note   Patient: Craig Kemp           Date of Birth: [birth year]           MRN: [id number] Visit Date: 03/14/2017              Requested by: Bailey MechPodraza, Cole Christopher, PA-C [phone] [address] [facility], [place] [id number] PCP: Bailey MechPodraza, Cole Christopher, PA-C  Chief Complaint  Patient presents with  . Left Leg - Follow-up    Bilateral BKA  . Right Leg - Follow-up      HPI: The patient is a 62 year old gentleman seen today in follow up for evaluation of blistered ulcerations to bilateral lower extremities from endbearing.   Has resumed PT for gait training. No longer using dressings.   Assessment & Plan: Visit Diagnoses:  1. S/P bilateral below knee amputation (HCC)     Plan: weight bearing as tolerated in shrinkers, continue with PT.  Follow-Up Instructions: Return if symptoms worsen or fail to improve.   Ortho Examd  Patient is alert, oriented, no adenopathy, well-dressed, normal affect, normal respiratory effort. On examination of bilateral below knee amputations, well healed. No erythema, drainage, open area. No sign of infection.   Imaging: No results found. No images are attached to the encounter.  Labs: Lab Results  Component Value Date   HGBA1C (H) 02/10/2009    7.4 (NOTE) The ADA recommends the following therapeutic goal for glycemic control related to Hgb A1c measurement: Goal of therapy: <6.5 Hgb A1c  Reference: American Diabetes Association: Clinical Practice Recommendations 2010, Diabetes Care, 2010, 33: (Suppl  1).   REPTSTATUS 02/15/2009 FINAL 02/13/2009   GRAMSTAIN  02/13/2009    RARE WCBP NO SQUAMOUS EPITHELIAL CELLS SEEN NO ORGANISMS SEEN   CULT NO GROWTH 02/13/2009    @LABSALLVALUES (HGBA1)@  There is no height or weight on file to calculate BMI.  Orders:  No orders of the defined types were placed in this encounter.  No orders of the defined types were placed in this encounter.    Procedures: No  procedures performed  Clinical Data: No additional findings.  ROS:  All other systems negative, except as noted in the HPI. Review of Systems  Constitutional: Negative for chills and fever.  Cardiovascular: Positive for leg swelling.  Skin: Positive for wound. Negative for color change and rash.    Objective: Vital Signs: There were no vitals taken for this visit.  Specialty Comments:  No specialty comments available.  PMFS History: Patient Active Problem List   Diagnosis Date Noted  . S/P bilateral below knee amputation (HCC) 02/28/2017   Past Medical History:  Diagnosis Date  . Asthma   . Diabetes mellitus    type 1  . Diabetic neuropathy (HCC)   . Diabetic retinopathy   . Hepatitis C   . Hypertension   . Hypothyroidism   . Kidney failure   . Pancreas (digestive gland) works poorly   . Thyroid disease     History reviewed. No pertinent family history.  Past Surgical History:  Procedure Laterality Date  . BELOW KNEE LEG AMPUTATION    . KIDNEY TRANSPLANT     Social History   Occupational History  . Not on file  Tobacco Use  . Smoking status: Not on file  Substance and Sexual Activity  . Alcohol use: Not on file  . Drug use: Not on file  . Sexual activity: Not on file

## 2018-07-21 DEATH — deceased
